# Patient Record
Sex: Male | Born: 1995 | Race: Black or African American | Hispanic: No | Marital: Single | Smoking: Current some day smoker
Health system: Southern US, Community
[De-identification: ages and names within clinical notes are randomized; demographics above are authoritative.]

---
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
## 2016-12-21 ENCOUNTER — Ambulatory Visit (HOSPITAL_COMMUNITY)
Admission: EM | Admit: 2016-12-21 | Discharge: 2016-12-21 | Disposition: A | Discharge status: Home / Self Care | Payer: Self-pay | Attending: Family | Admitting: Family

## 2016-12-21 ENCOUNTER — Encounter (HOSPITAL_COMMUNITY): Payer: Self-pay | Admitting: Emergency Medicine

## 2016-12-21 DIAGNOSIS — K0889 Other specified disorders of teeth and supporting structures: Secondary | ICD-10-CM

## 2016-12-21 DIAGNOSIS — K047 Periapical abscess without sinus: Secondary | ICD-10-CM

## 2016-12-21 MED ORDER — AMOXICILLIN-POT CLAVULANATE 875-125 MG PO TABS: 1.0000 | ORAL_TABLET | Freq: Two times a day (BID) | ORAL | 0 refills | Status: AC

## 2016-12-21 MED ORDER — NAPROXEN 500 MG PO TABS: 500.0000 mg | ORAL_TABLET | Freq: Two times a day (BID) | ORAL | 0 refills | Status: DC | PRN

## 2016-12-21 NOTE — Discharge Instructions (Signed)
Recommend start Augmentin 875mg  twice a day as directed for dental infection. May take Naproxen 500mg  every 12 hours as needed for pain and inflammation. May gargle with salt water and continue to apply OraGel as needed for comfort. You need to see a dentist for continued care and evaluation.

## 2016-12-21 NOTE — ED Triage Notes (Signed)
Pt has been having left lower dental pain for one week.  Pt does not have a dentist.

## 2016-12-21 NOTE — ED Provider Notes (Cosign Needed)
CSN: [id number]     Arrival date & time 12/21/16  1010 History   First MD Initiated Contact with Patient 12/21/16 1102     Chief Complaint  Patient presents with  . Dental Pain   (Consider location/radiation/quality/duration/timing/severity/associated sxs/prior Treatment) 21 year old male presents with left lower dental pain for the past 2 weeks. Pain has become more severe in the past week. Also noticed some discharge from the gum near his last molar. Denies any fever or GI symptoms. Has used Advil and Oragel with some relief. Has not seen a dentist in many years. Does smoke cigars often. Has no other chronic health issues. Takes no daily medication.    The history is provided by the patient.    History reviewed. No pertinent past medical history. History reviewed. No pertinent surgical history. History reviewed. No pertinent family history. Social History  Substance Use Topics  . Smoking status: Current Some Day Smoker    Types: Cigars  . Smokeless tobacco: Never Used     Comment: black & milds  . Alcohol use Yes     Comment: occasional    Review of Systems  Constitutional: Negative for appetite change, chills, fatigue and fever.  HENT: Positive for dental problem. Negative for congestion, ear pain, facial swelling, mouth sores, postnasal drip, rhinorrhea, sore throat and trouble swallowing.   Respiratory: Negative for cough, chest tightness, shortness of breath and wheezing.   Cardiovascular: Negative for chest pain.  Gastrointestinal: Negative for abdominal pain, diarrhea, nausea and vomiting.  Musculoskeletal: Negative for neck pain and neck stiffness.  Skin: Negative for rash.  Neurological: Positive for headaches. Negative for dizziness, syncope, weakness and light-headedness.  Hematological: Positive for adenopathy.    Allergies  Patient has no known allergies.  Home Medications   Prior to Admission medications   Medication Sig Start Date End Date Taking?  Authorizing Provider  amoxicillin-clavulanate (AUGMENTIN) 875-125 MG tablet Take 1 tablet by mouth every 12 (twelve) hours. 12/21/16 12/31/16  Sudie Grumbling, NP  naproxen (NAPROSYN) 500 MG tablet Take 1 tablet (500 mg total) by mouth 2 (two) times daily as needed for moderate pain. 12/21/16   Sudie Grumbling, NP   Meds Ordered and Administered this Visit  Medications - No data to display  BP 127/79 (BP Location: Right Arm)   Pulse 66   Temp 98.6 F (37 C) (Oral)   SpO2 100%  No data found.   Physical Exam  Constitutional: He is oriented to person, place, and time. Vital signs are normal. He appears well-developed and well-nourished. No distress.  HENT:  Head: Normocephalic and atraumatic.  Right Ear: Hearing and external ear normal.  Left Ear: Hearing and external ear normal.  Nose: Nose normal.  Mouth/Throat: Oropharynx is clear and moist and mucous membranes are normal. Oral lesions present. Dental abscesses present.    Irritation, redness and swelling present posterior of last molar on left lower area. Slight white discharge present. Very tender to palpation. Also flesh-colored, slightly tender tissue present on lateral aspect of 1st lower left molar. No discharge or drainage. Remainder of teeth are in adequate repair. No distinct caries seen.   Neck: Normal range of motion. Neck supple.  Cardiovascular: Normal rate, regular rhythm and normal heart sounds.   Pulmonary/Chest: Effort normal and breath sounds normal. No respiratory distress. He has no wheezes.  Lymphadenopathy:       Head (right side): No tonsillar and no occipital adenopathy present.       Head (left  side): Tonsillar and occipital adenopathy present.    He has no cervical adenopathy.       Right cervical: No superficial cervical adenopathy present.      Left cervical: No superficial cervical adenopathy present.  Neurological: He is alert and oriented to person, place, and time.  Skin: Skin is warm and dry.  Capillary refill takes less than 2 seconds.  Psychiatric: He has a normal mood and affect. His behavior is normal. Judgment and thought content normal.    Urgent Care Course     Procedures (including critical care time)  Labs Review Labs Reviewed - No data to display  Imaging Review No results found.   Visual Acuity Review  Right Eye Distance:   Left Eye Distance:   Bilateral Distance:    Right Eye Near:   Left Eye Near:    Bilateral Near:         MDM   1. Dental infection   2. Pain, dental    Discussed that he probably has an infection behind his last molar. Recommend start Augmentin 875mg  twice a day as directed. May take Naproxen 500mg  every 12 hours as needed for pain and inflammation. May gargle with salt water and continue to apply OraGel as needed for comfort. Uncertain about etiology of other tissue next to 1st molar- discussed that he needs further evaluation by a dentist. Patient to call and schedule follow-up appointment with a dentist for next week.       Sudie GrumblingAnn Berry Yashvi Jasinski, NP 12/21/16 [phone]

## 2017-04-06 ENCOUNTER — Encounter (HOSPITAL_COMMUNITY): Payer: Self-pay

## 2017-04-06 DIAGNOSIS — Z79899 Other long term (current) drug therapy: Secondary | ICD-10-CM | POA: Insufficient documentation

## 2017-04-06 DIAGNOSIS — K047 Periapical abscess without sinus: Secondary | ICD-10-CM | POA: Insufficient documentation

## 2017-04-06 DIAGNOSIS — K029 Dental caries, unspecified: Secondary | ICD-10-CM | POA: Insufficient documentation

## 2017-04-06 DIAGNOSIS — E041 Nontoxic single thyroid nodule: Secondary | ICD-10-CM | POA: Insufficient documentation

## 2017-04-06 DIAGNOSIS — F17290 Nicotine dependence, other tobacco product, uncomplicated: Secondary | ICD-10-CM | POA: Insufficient documentation

## 2017-04-06 NOTE — ED Triage Notes (Signed)
Pt presents with L sided lower jaw swelling. He is reporting pain in his L lower molars x5 days. Denies SOB or difficulty swallowing. A&Ox4.

## 2017-04-07 ENCOUNTER — Encounter (HOSPITAL_COMMUNITY): Payer: Self-pay | Admitting: Radiology

## 2017-04-07 ENCOUNTER — Emergency Department (HOSPITAL_COMMUNITY): Payer: Self-pay

## 2017-04-07 ENCOUNTER — Emergency Department (HOSPITAL_COMMUNITY)
Admission: EM | Admit: 2017-04-07 | Discharge: 2017-04-07 | Disposition: A | Discharge status: Home / Self Care | Payer: Self-pay | Attending: Emergency Medicine | Admitting: Emergency Medicine

## 2017-04-07 DIAGNOSIS — K029 Dental caries, unspecified: Secondary | ICD-10-CM

## 2017-04-07 DIAGNOSIS — E041 Nontoxic single thyroid nodule: Secondary | ICD-10-CM

## 2017-04-07 DIAGNOSIS — K047 Periapical abscess without sinus: Secondary | ICD-10-CM

## 2017-04-07 LAB — I-STAT CHEM 8, ED
BUN: 15 mg/dL (ref 6–20)
Calcium, Ion: 1.24 mmol/L (ref 1.15–1.40)
Chloride: 102 mmol/L (ref 101–111)
Creatinine, Ser: 0.7 mg/dL (ref 0.61–1.24)
Glucose, Bld: 103 mg/dL — ABNORMAL HIGH (ref 65–99)
HCT: 45 % (ref 39.0–52.0)
Hemoglobin: 15.3 g/dL (ref 13.0–17.0)
Potassium: 3.5 mmol/L (ref 3.5–5.1)
Sodium: 141 mmol/L (ref 135–145)
TCO2: 28 mmol/L (ref 0–100)

## 2017-04-07 MED ORDER — HYDROCODONE-ACETAMINOPHEN 5-325 MG PO TABS: 1.0000 | ORAL_TABLET | Freq: Four times a day (QID) | ORAL | 0 refills | Status: DC | PRN

## 2017-04-07 MED ORDER — BENZOCAINE 20 % MT AERO
INHALATION_SPRAY | Freq: Once | OROMUCOSAL | Status: AC
  Administered 2017-04-07: 06:00:00 via OROMUCOSAL
  Filled 2017-04-07: qty 57

## 2017-04-07 MED ORDER — IOPAMIDOL (ISOVUE-300) INJECTION 61%
INTRAVENOUS | Status: AC
  Filled 2017-04-07: qty 75

## 2017-04-07 MED ORDER — HYDROCODONE-ACETAMINOPHEN 5-325 MG PO TABS
2.0000 | ORAL_TABLET | Freq: Once | ORAL | Status: AC
  Administered 2017-04-07: 2 via ORAL
  Filled 2017-04-07: qty 2

## 2017-04-07 MED ORDER — CLINDAMYCIN HCL 300 MG PO CAPS
600.0000 mg | ORAL_CAPSULE | Freq: Once | ORAL | Status: AC
  Administered 2017-04-07: 600 mg via ORAL
  Filled 2017-04-07: qty 2

## 2017-04-07 MED ORDER — NAPROXEN 500 MG PO TABS: 500.0000 mg | ORAL_TABLET | Freq: Two times a day (BID) | ORAL | 0 refills | Status: DC

## 2017-04-07 MED ORDER — IOPAMIDOL (ISOVUE-300) INJECTION 61%
75.0000 mL | Freq: Once | INTRAVENOUS | Status: AC | PRN
  Administered 2017-04-07: 75 mL via INTRAVENOUS

## 2017-04-07 MED ORDER — CLINDAMYCIN HCL 150 MG PO CAPS: 450.0000 mg | ORAL_CAPSULE | Freq: Three times a day (TID) | ORAL | 0 refills | Status: DC

## 2017-04-07 NOTE — ED Provider Notes (Signed)
[facility]-EMERGENCY DEPT Provider Note   CSN: [id number] Arrival date & time: 04/06/17  2220     History   Chief Complaint Chief Complaint  Patient presents with  . Facial Swelling  . Dental Pain    HPI Anthony Fletcher is a 21 y.o. male with no major medical problems presents to the Emergency Department complaining of gradual, persistent, progressively worsening left lower jaw swelling onset 1 day ago. Associated symptoms include left lower dental pain onset 5 days ago.  Pt has been taking ibuprofen without relief.  Pt denies dysphagia, fever, N/V/D, CP, SOB.  Pt reports that eating makes the symptoms worse and has therefore not eaten much in the last few days.  Pt reports similar symptoms 3 mos ago and treated with Augmentin.  Pt did not follow-up with dentist.     The history is provided by the patient and medical records. No language interpreter was used.    History reviewed. No pertinent past medical history.  There are no active problems to display for this patient.   History reviewed. No pertinent surgical history.     Home Medications    Prior to Admission medications   Medication Sig Start Date End Date Taking? Authorizing Provider  ibuprofen (ADVIL,MOTRIN) 200 MG tablet Take 400 mg by mouth every 6 (six) hours as needed for headache, mild pain or moderate pain.   Yes [provider]  clindamycin (CLEOCIN) 150 MG capsule Take 3 capsules (450 mg total) by mouth 3 (three) times daily. 04/07/17   Shiva Karis, Dahlia Client, PA-C  HYDROcodone-acetaminophen (NORCO/VICODIN) 5-325 MG tablet Take 1 tablet by mouth every 6 (six) hours as needed. 04/07/17   Turrell Severt, Dahlia Client, PA-C  naproxen (NAPROSYN) 500 MG tablet Take 1 tablet (500 mg total) by mouth 2 (two) times daily with a meal. 04/07/17   Keina Mutch, Boyd Kerbs    Family History History reviewed. No pertinent family history.  Social History Social History  Substance Use Topics  . Smoking status: Current Some  Day Smoker    Types: Cigars  . Smokeless tobacco: Never Used     Comment: black & milds  . Alcohol use Yes     Comment: occasional     Allergies   Patient has no known allergies.   Review of Systems Review of Systems  Constitutional: Negative for appetite change, chills and fever.  HENT: Positive for dental problem and facial swelling. Negative for drooling, ear pain, nosebleeds, postnasal drip, rhinorrhea and trouble swallowing.   Eyes: Negative for pain and redness.  Respiratory: Negative for cough and wheezing.   Cardiovascular: Negative for chest pain.  Gastrointestinal: Negative for abdominal pain, nausea and vomiting.  Musculoskeletal: Negative for neck pain and neck stiffness.  Skin: Negative for color change and rash.  Neurological: Negative for weakness, light-headedness and headaches.  All other systems reviewed and are negative.    Physical Exam Updated Vital Signs BP (!) 142/88 (BP Location: Left Arm)   Pulse 82   Temp 98.4 F (36.9 C) (Oral)   Resp 20   SpO2 100%   Physical Exam  Constitutional: He appears well-developed and well-nourished.  HENT:  Head: Normocephalic.  Right Ear: Tympanic membrane, external ear and ear canal normal.  Left Ear: Tympanic membrane, external ear and ear canal normal.  Nose: Nose normal. Right sinus exhibits no maxillary sinus tenderness and no frontal sinus tenderness. Left sinus exhibits no maxillary sinus tenderness and no frontal sinus tenderness.  Mouth/Throat: Uvula is midline, oropharynx is clear and moist  and mucous membranes are normal. No oral lesions. There is trismus in the jaw. Abnormal dentition. Dental caries present. No uvula swelling or lacerations. No oropharyngeal exudate, posterior oropharyngeal edema, posterior oropharyngeal erythema or tonsillar abscesses.    Dental caries tooth 17, 18 and 19 No fluctuance or induration to the buccal mucosa or floor of the mouth  Eyes: Conjunctivae are normal. Pupils are  equal, round, and reactive to light. Right eye exhibits no discharge. Left eye exhibits no discharge.  Neck: Normal range of motion. Neck supple.  No stridor Handling secretions without difficulty No nuchal rigidity  Cardiovascular: Normal rate, regular rhythm and normal heart sounds.   Pulmonary/Chest: Effort normal. No respiratory distress.  Equal chest rise  Abdominal: Soft. Bowel sounds are normal. He exhibits no distension. There is no tenderness.  Lymphadenopathy:       Head (left side): Submental, submandibular and tonsillar adenopathy present.  Neurological: He is alert.  Skin: Skin is warm and dry.  Psychiatric: He has a normal mood and affect.  Nursing note and vitals reviewed.    ED Treatments / Results  Labs (all labs ordered are listed, but only abnormal results are displayed) Labs Reviewed  I-STAT CHEM 8, ED - Abnormal; Notable for the following:       Result Value   Glucose, Bld 103 (*)    All other components within normal limits    EKG  EKG Interpretation None       Radiology Ct Soft Tissue Neck W Contrast  Result Date: 04/07/2017 CLINICAL DATA:  21 y/o  M; left-sided lower jaw swelling. EXAM: CT NECK WITH CONTRAST TECHNIQUE: Multidetector CT imaging of the neck was performed using the standard protocol following the bolus administration of intravenous contrast. CONTRAST:  75mL ISOVUE-300 IOPAMIDOL (ISOVUE-300) INJECTION 61% COMPARISON:  None. FINDINGS: Pharynx and larynx: Normal. No mass or swelling. Salivary glands: No inflammation, mass, or stone. Thyroid: 7 mm nodule within the right lobe of thyroid. Lymph nodes: None enlarged or abnormal density. Vascular: Negative. Limited intracranial: Negative. Visualized orbits: Negative. Mastoids and visualized paranasal sinuses: Clear. Skeleton: Periapical lucency and dental carie associated with the left mandibular first molar smaller compatible with odontogenic disease. Abscess overlying the left mandibular body  measuring 21 x 7 x 18 mm (AP x ML x CC series 3, image 48 and series 7, image 37) that is likely odontogenic. Extensive surrounding soft tissue throughout the subcutaneous fat and left submandibular space of the face. No extension of inflammatory changes in the deep cervical spaces of the neck. Upper chest: Negative. Other: None. IMPRESSION: 1. Odontogenic abscess along the outer margin of the left mandibular body measuring up to 21 mm. This is probably associated with the left first mandibular molar which demonstrates a dental carie and periapical lucency. 2. Inflammation throughout the subcutaneous fat of the left lower face and left submandibular space. No extension of inflammatory changes into the deep cervical compartments. Electronically Signed   By: Mitzi Hansen M.D.   On: 04/07/2017 05:26    Procedures .Marland KitchenIncision and Drainage Date/Time: 04/07/2017 6:06 AM Performed by: Dierdre Forth Authorized by: Dierdre Forth   Consent:    Consent obtained:  Verbal   Consent given by:  Patient   Risks discussed:  Bleeding, incomplete drainage and infection   Alternatives discussed:  No treatment Location:    Type:  Abscess   Location:  Mouth   Mouth location:  Alveolar process Anesthesia (see MAR for exact dosages):    Anesthesia method:  Topical  application (lolicaine, hurricaine spray) Procedure type:    Complexity:  Simple Procedure details:    Incision types:  Single straight   Incision depth:  Submucosal   Scalpel blade:  11   Wound management:  Probed and deloculated and irrigated with saline   Drainage:  Bloody and purulent   Drainage amount:  Copious   Wound treatment:  Wound left open   Packing materials:  None Post-procedure details:    Patient tolerance of procedure:  Tolerated well, no immediate complications   (including critical care time)  Medications Ordered in ED Medications  iopamidol (ISOVUE-300) 61 % injection (not administered)    HYDROcodone-acetaminophen (NORCO/VICODIN) 5-325 MG per tablet 2 tablet (2 tablets Oral Given 04/07/17 0310)  iopamidol (ISOVUE-300) 61 % injection 75 mL (75 mLs Intravenous Contrast Given 04/07/17 0452)  clindamycin (CLEOCIN) capsule 600 mg (600 mg Oral Given 04/07/17 0546)  Benzocaine (HURRCAINE) 20 % mouth spray ( Mouth/Throat Given 04/07/17 0546)     Initial Impression / Assessment and Plan / ED Course  I have reviewed the triage vital signs and the nursing notes.  Pertinent labs & imaging results that were available during my care of the patient were reviewed by me and considered in my medical decision making (see chart for details).     Patient with toothache.  Gross abscess and trismus. Patient given pain control with improvement in jaw mobility. CT scan shows an odontogenic abscess along the outer margin of the left mandibular body and inflammation throughout the subcutaneous fat but no evidence of deep space infection. Patient is afebrile. Incision and drainage with copious amounts of purulent drainage. Exam unconcerning for Ludwig's angina or spread of infection.  Will treat with clindamycin and anti-inflammatories medicine.  Urged patient to follow-up with dentist.    Of note, patient with thyroid nodule. Patient given copy of CT scan and will follow with PCP for this.   Final Clinical Impressions(s) / ED Diagnoses   Final diagnoses:  Dental abscess  Dental caries  Thyroid nodule    New Prescriptions New Prescriptions   CLINDAMYCIN (CLEOCIN) 150 MG CAPSULE    Take 3 capsules (450 mg total) by mouth 3 (three) times daily.   HYDROCODONE-ACETAMINOPHEN (NORCO/VICODIN) 5-325 MG TABLET    Take 1 tablet by mouth every 6 (six) hours as needed.   NAPROXEN (NAPROSYN) 500 MG TABLET    Take 1 tablet (500 mg total) by mouth 2 (two) times daily with a meal.     Brazen Domangue, Boyd KerbsHannah, PA-C 04/07/17 [id number]    Dione BoozeGlick, David, MD 04/07/17 [phone]
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
## 2017-04-07 NOTE — Discharge Instructions (Signed)
1. Medications: vicodin, clindamycin, naprosyn, usual home medications 2. Treatment: rest, drink plenty of fluids, take medications as prescribed 3. Follow Up: Please followup with dentistry within 1 week for discussion of your diagnoses and further evaluation after today's visit; if you do not have a primary care doctor use the resource guide provided to find one; Return to the ER for high fevers, difficulty breathing, difficulty swallowing or other concerning symptoms

## 2017-08-07 ENCOUNTER — Emergency Department (HOSPITAL_COMMUNITY)
Admission: EM | Admit: 2017-08-07 | Discharge: 2017-08-07 | Disposition: A | Discharge status: Home / Self Care | Payer: Self-pay | Attending: Emergency Medicine | Admitting: Emergency Medicine

## 2017-08-07 ENCOUNTER — Encounter (HOSPITAL_COMMUNITY): Payer: Self-pay | Admitting: Emergency Medicine

## 2017-08-07 DIAGNOSIS — F17210 Nicotine dependence, cigarettes, uncomplicated: Secondary | ICD-10-CM | POA: Insufficient documentation

## 2017-08-07 DIAGNOSIS — Z79899 Other long term (current) drug therapy: Secondary | ICD-10-CM | POA: Insufficient documentation

## 2017-08-07 DIAGNOSIS — K047 Periapical abscess without sinus: Secondary | ICD-10-CM | POA: Insufficient documentation

## 2017-08-07 MED ORDER — CHLORHEXIDINE GLUCONATE 0.12 % MT SOLN: 15.0000 mL | Freq: Two times a day (BID) | OROMUCOSAL | 0 refills | Status: AC

## 2017-08-07 MED ORDER — CLINDAMYCIN HCL 150 MG PO CAPS: 450.0000 mg | ORAL_CAPSULE | Freq: Three times a day (TID) | ORAL | 0 refills | Status: DC

## 2017-08-07 NOTE — Discharge Instructions (Signed)
You have a dental infection. It is very important that you get evaluated by a dentist as soon as possible. Call the dentist listed first thing tomorrow to schedule an appointment. Ibuprofen as needed for pain. Take your full course of antibiotics. Read the instructions below.  Eat a soft or liquid diet and rinse your mouth out after meals with warm water. You should see a dentist or return here at once if you have increased swelling, increased pain or uncontrolled bleeding from the site of your injury.  SEEK MEDICAL CARE IF:  You have increased pain not controlled with medicines.  You have swelling around your tooth, in your face or neck.  You have bleeding which starts, continues, or gets worse.  You have a fever >101 If you are unable to open your mouth

## 2017-08-07 NOTE — ED Provider Notes (Signed)
[facility]-EMERGENCY DEPT Provider Note   CSN: [id number] Arrival date & time: 08/07/17  1554     History   Chief Complaint Chief Complaint  Patient presents with  . Dental Pain    HPI Anthony Fletcher is a 21 y.o. male.  The history is provided by the patient and medical records. No language interpreter was used.  Dental Pain        Anthony Fletcher is a 21 y.o. male who presents to the Emergency Department complaining of persistent, gradually worsening, bilateral, lower dental pain beginning 3-4 months ago, but acutely worsened the last week. Pt describes their pain as constant, throbbing. Pt has tried salt water rinses at home with minimal relief of pain. No medications taken. Pain is exacerbated by eating. They are not currentlyfollowed by dentistry.  Pt denies facial swelling, fever, chills, difficulty breathing, difficulty swallowing.   History reviewed. No pertinent past medical history.  There are no active problems to display for this patient.   History reviewed. No pertinent surgical history.     Home Medications    Prior to Admission medications   Medication Sig Start Date End Date Taking? Authorizing Provider  chlorhexidine (PERIDEX) 0.12 % solution Use as directed 15 mLs in the mouth or throat 2 (two) times daily. 08/07/17   Erinne Gillentine, Chase PicketJaime Pilcher, PA-C  clindamycin (CLEOCIN) 150 MG capsule Take 3 capsules (450 mg total) by mouth 3 (three) times daily. 08/07/17   Judiann Celia, Chase PicketJaime Pilcher, PA-C  HYDROcodone-acetaminophen (NORCO/VICODIN) 5-325 MG tablet Take 1 tablet by mouth every 6 (six) hours as needed. 04/07/17   Muthersbaugh, Dahlia ClientHannah, PA-C  ibuprofen (ADVIL,MOTRIN) 200 MG tablet Take 400 mg by mouth every 6 (six) hours as needed for headache, mild pain or moderate pain.    [provider]  naproxen (NAPROSYN) 500 MG tablet Take 1 tablet (500 mg total) by mouth 2 (two) times daily with a meal. 04/07/17   Muthersbaugh, Dahlia ClientHannah, PA-C     Family History No family history on file.  Social History Social History  Substance Use Topics  . Smoking status: Current Some Day Smoker    Types: Cigars  . Smokeless tobacco: Never Used     Comment: black & milds  . Alcohol use Yes     Comment: occasional     Allergies   Patient has no known allergies.   Review of Systems Review of Systems  Constitutional: Negative for chills and fever.  HENT: Positive for dental problem. Negative for sore throat and trouble swallowing.   Respiratory: Negative for shortness of breath.   Skin: Negative for color change.     Physical Exam Updated Vital Signs BP (!) 144/71 (BP Location: Left Arm)   Pulse 66   Temp 98.5 F (36.9 C) (Oral)   Resp 16   Ht 5\' 11"  (1.803 m)   Wt 72.6 kg (160 lb)   SpO2 99%   BMI 22.32 kg/m   Physical Exam  Constitutional: He appears well-developed and well-nourished. No distress.  HENT:  Head: Normocephalic and atraumatic.  Mouth/Throat:    Dental cavities and poor oral dentition noted. Pain along tooth as depicted in image. No abscess noted. Midline uvula. No trismus. OP moist and clear. No oropharyngeal erythema or edema. Neck supple with no tenderness. No facial edema.  Neck: Neck supple.  Cardiovascular: Normal rate, regular rhythm and normal heart sounds.   No murmur heard. Pulmonary/Chest: Effort normal and breath sounds normal. No respiratory distress. He has no  wheezes. He has no rales.  Musculoskeletal: Normal range of motion.  Neurological: He is alert.  Skin: Skin is warm and dry.  Nursing note and vitals reviewed.    ED Treatments / Results  Labs (all labs ordered are listed, but only abnormal results are displayed) Labs Reviewed - No data to display  EKG  EKG Interpretation None       Radiology No results found.  Procedures Procedures (including critical care time)  Medications Ordered in ED Medications - No data to display   Initial Impression /  Assessment and Plan / ED Course  I have reviewed the triage vital signs and the nursing notes.  Pertinent labs & imaging results that were available during my care of the patient were reviewed by me and considered in my medical decision making (see chart for details).    Anthony Fletcher is a 21 y.o. male who presents to ED for dental pain. No abscess requiring immediate incision and drainage. Patient is afebrile, non toxic appearing, and swallowing secretions well. Exam not concerning for Ludwig's angina or pharyngeal abscess. Will treat with clinda and peridex. I provided referral to dentist and stressed the importance of dental follow up for ultimate management of dental pain. Patient voices understanding and is agreeable to plan.  Final Clinical Impressions(s) / ED Diagnoses   Final diagnoses:  Dental infection    New Prescriptions New Prescriptions   CHLORHEXIDINE (PERIDEX) 0.12 % SOLUTION    Use as directed 15 mLs in the mouth or throat 2 (two) times daily.   CLINDAMYCIN (CLEOCIN) 150 MG CAPSULE    Take 3 capsules (450 mg total) by mouth 3 (three) times daily.     Dvid Pendry, Chase Picket, PA-C 08/07/17 1818    Shaune Pollack, MD 08/08/17 1140

## 2017-08-07 NOTE — ED Triage Notes (Signed)
Patient c/o right and left lower dental pain x2 weeks. Denies fevers. Denies follow up with dentist.

## 2018-10-11 ENCOUNTER — Ambulatory Visit (HOSPITAL_COMMUNITY)
Admission: EM | Admit: 2018-10-11 | Discharge: 2018-10-11 | Discharge status: Against Medical Advice | Payer: Self-pay | Attending: Family Medicine | Admitting: Family Medicine

## 2018-10-11 NOTE — ED Notes (Signed)
Pt did not answer when called for triage x 2.  Will d/c

## 2018-10-11 NOTE — ED Notes (Signed)
Pt called to triage x1.  No answer. 

## 2018-10-12 ENCOUNTER — Emergency Department (HOSPITAL_COMMUNITY): Payer: Self-pay

## 2018-10-12 ENCOUNTER — Other Ambulatory Visit: Payer: Self-pay

## 2018-10-12 ENCOUNTER — Encounter (HOSPITAL_COMMUNITY): Payer: Self-pay | Admitting: Emergency Medicine

## 2018-10-12 ENCOUNTER — Emergency Department (HOSPITAL_COMMUNITY)
Admission: EM | Admit: 2018-10-12 | Discharge: 2018-10-12 | Disposition: A | Discharge status: Home / Self Care | Payer: Self-pay | Attending: Emergency Medicine | Admitting: Emergency Medicine

## 2018-10-12 DIAGNOSIS — R69 Illness, unspecified: Secondary | ICD-10-CM

## 2018-10-12 DIAGNOSIS — F17210 Nicotine dependence, cigarettes, uncomplicated: Secondary | ICD-10-CM | POA: Insufficient documentation

## 2018-10-12 DIAGNOSIS — J111 Influenza due to unidentified influenza virus with other respiratory manifestations: Secondary | ICD-10-CM

## 2018-10-12 DIAGNOSIS — J101 Influenza due to other identified influenza virus with other respiratory manifestations: Secondary | ICD-10-CM | POA: Insufficient documentation

## 2018-10-12 MED ORDER — ALBUTEROL SULFATE HFA 108 (90 BASE) MCG/ACT IN AERS
2.0000 | INHALATION_SPRAY | RESPIRATORY_TRACT | Status: DC | PRN
  Administered 2018-10-12: 2 via RESPIRATORY_TRACT
  Filled 2018-10-12: qty 6.7

## 2018-10-12 MED ORDER — BENZONATATE 200 MG PO CAPS: 200.0000 mg | ORAL_CAPSULE | Freq: Three times a day (TID) | ORAL | 0 refills | Status: AC | PRN

## 2018-10-12 NOTE — ED Triage Notes (Addendum)
Pt brought in by ems for flu like symptoms, pt reports fever, chills, congestion and weakness x 1 week. Pt received liquid tylenol by ems. Girlfriend tested positive flu, other family had pneumonia. Pt did not have the flu shot.

## 2018-10-12 NOTE — ED Provider Notes (Signed)
[facility]-EMERGENCY DEPT Provider Note   CSN: [id number] Arrival date & time: 10/12/18  1313     History   Chief Complaint Chief Complaint  Patient presents with  . flu like symptom  . Fever  . Nasal Congestion    HPI Anthony Fletcher is a 23 y.o. male who presents to the ED via EMS with flu like symptoms that started a week ago. Patient reports chills, congestion, cough and body aches. Patient states that his girlfriend tested positive for flu.  The history is provided by the patient. No language interpreter was used.  Influenza  Presenting symptoms: cough, fatigue, fever, headache, myalgias and sore throat   Presenting symptoms: no vomiting   Severity:  Moderate Onset quality:  Gradual Duration:  1 week Progression:  Worsening Chronicity:  New Relieved by:  Nothing Worsened by:  Nothing Ineffective treatments:  OTC medications Associated symptoms: chills and nasal congestion   Associated symptoms: no ear pain and no neck stiffness   Risk factors: sick contacts     History reviewed. No pertinent past medical history.  There are no active problems to display for this patient.   History reviewed. No pertinent surgical history.      Home Medications    Prior to Admission medications   Medication Sig Start Date End Date Taking? Authorizing Provider  benzonatate (TESSALON) 200 MG capsule Take 1 capsule (200 mg total) by mouth 3 (three) times daily as needed for cough. 10/12/18   Janne Napoleon, NP  chlorhexidine (PERIDEX) 0.12 % solution Use as directed 15 mLs in the mouth or throat 2 (two) times daily. 08/07/17   Ward, Chase Picket, PA-C    Family History History reviewed. No pertinent family history.  Social History Social History   Tobacco Use  . Smoking status: Current Some Day Smoker    Types: Cigars  . Smokeless tobacco: Never Used  . Tobacco comment: black & milds  Substance Use Topics  . Alcohol use: Yes    Comment: occasional   . Drug use: Yes    Frequency: 3.0 times per week    Types: Marijuana     Allergies   Patient has no known allergies.   Review of Systems Review of Systems  Constitutional: Positive for chills, fatigue and fever.  HENT: Positive for congestion and sore throat. Negative for ear pain.   Eyes: Negative for pain, discharge, itching and visual disturbance.  Respiratory: Positive for cough. Negative for wheezing.   Cardiovascular: Negative for chest pain.  Gastrointestinal: Negative for abdominal pain and vomiting.  Genitourinary: Negative for dysuria, frequency and urgency.  Musculoskeletal: Positive for myalgias. Negative for back pain and neck stiffness.  Skin: Negative for rash.  Neurological: Positive for headaches. Negative for syncope.  Hematological: Negative for adenopathy.  Psychiatric/Behavioral: Negative for confusion.     Physical Exam Updated Vital Signs BP 129/68 (BP Location: Right Arm)   Pulse 89   Temp 98.7 F (37.1 C) (Oral)   Resp 16   SpO2 100%   Physical Exam Vitals signs and nursing note reviewed.  Constitutional:      General: He is not in acute distress.    Appearance: He is well-developed.  HENT:     Head: Normocephalic.     Right Ear: Tympanic membrane normal.     Left Ear: Tympanic membrane normal.     Nose: Congestion present.     Mouth/Throat:     Mouth: Mucous membranes are moist.  Pharynx: No posterior oropharyngeal erythema.  Eyes:     Conjunctiva/sclera: Conjunctivae normal.  Neck:     Musculoskeletal: Normal range of motion and neck supple.  Cardiovascular:     Rate and Rhythm: Normal rate.  Pulmonary:     Effort: Pulmonary effort is normal. No respiratory distress.     Breath sounds: No wheezing or rales.  Abdominal:     Palpations: Abdomen is soft.     Tenderness: There is no abdominal tenderness.  Musculoskeletal: Normal range of motion.  Lymphadenopathy:     Cervical: No cervical adenopathy.  Skin:    General: Skin  is warm and dry.  Neurological:     Mental Status: He is alert and oriented to person, place, and time.  Psychiatric:        Mood and Affect: Mood normal.      ED Treatments / Results  Labs (all labs ordered are listed, but only abnormal results are displayed) Labs Reviewed - No data to display  Radiology Dg Chest 2 View  Result Date: 10/12/2018 CLINICAL DATA:  Cough and fever for 1 week, chills, congestion, and weakness, smoker EXAM: CHEST - 2 VIEW COMPARISON:  None FINDINGS: Normal heart size, mediastinal contours, and pulmonary vascularity. Lungs clear. No pulmonary infiltrate, pleural effusion or pneumothorax. Bones unremarkable. IMPRESSION: No acute abnormalities. Electronically Signed   By: Ulyses Southward M.D.   On: 10/12/2018 17:04    Procedures Procedures (including critical care time)  Medications Ordered in ED Medications - No data to display   Initial Impression / Assessment and Plan / ED Course  I have reviewed the triage vital signs and the nursing notes. SUBJECTIVE:  Anthony Fletcher is a 23 y.o. male who present complaining of flu-like symptoms: fevers, chills, myalgias, congestion, sore throat and cough for 7 days. Denies dyspnea or wheezing.  OBJECTIVE: Appears moderately ill but not toxic; temperature as noted in vitals. Ears normal. Throat and pharynx normal.  Neck supple. No adenopathy in the neck. Sinuses non tender. The chest is clear.  ASSESSMENT: Influenza like symptoms  PLAN: Symptomatic therapy suggested: rest, increase fluids, gargle prn for sore throat, use mist of vaporizer prn and return if symptoms persist or worsen. Final Clinical Impressions(s) / ED Diagnoses   Final diagnoses:  Influenza-like illness    ED Discharge Orders         Ordered    benzonatate (TESSALON) 200 MG capsule  3 times daily PRN     10/12/18 1717           Kerrie Buffalo [place], NP 10/13/18 2154    Lorre Nick, MD 10/13/18 2325

## 2019-02-13 IMAGING — CT CT NECK W/ CM
4 of 5 series · 15 of 33 positions shown, 17 images · IV contrast (iopamidol)
Comparison: None.

CLINICAL DATA: 20 y/o  M; left-sided lower jaw swelling.

EXAM:
CT NECK WITH CONTRAST
TECHNIQUE: Multidetector CT imaging of the neck was performed using the
standard protocol following the bolus administration of intravenous
contrast.
CONTRAST:  75mL 5SDOPP-QII IOPAMIDOL (5SDOPP-QII) INJECTION 61%

[Series 3: neck with st · axial · 0.50mm/px · z∈[-231,-117]mm · 3 of 115 slices shown]
[im 29/115  bone]
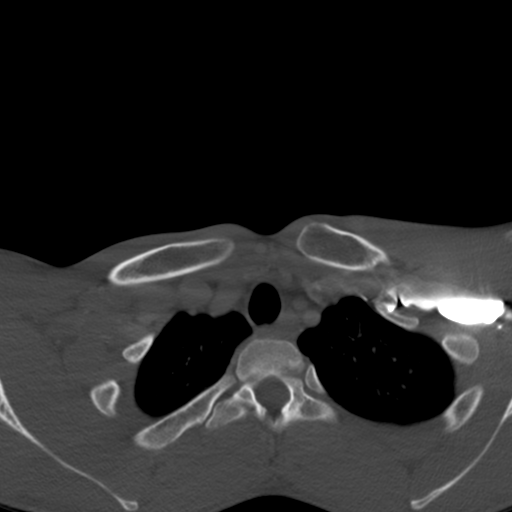
[im 58/115  bone]
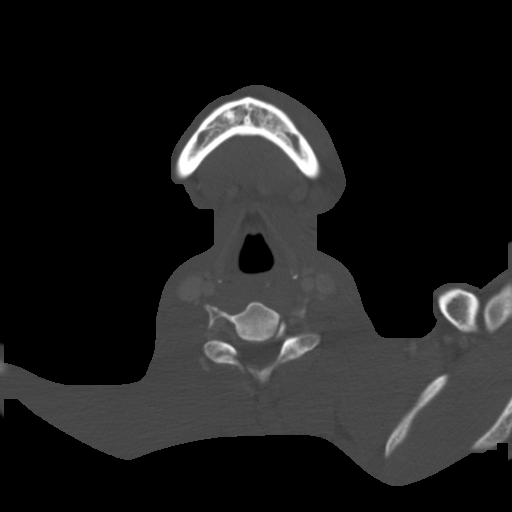
[im 86/115  bone]
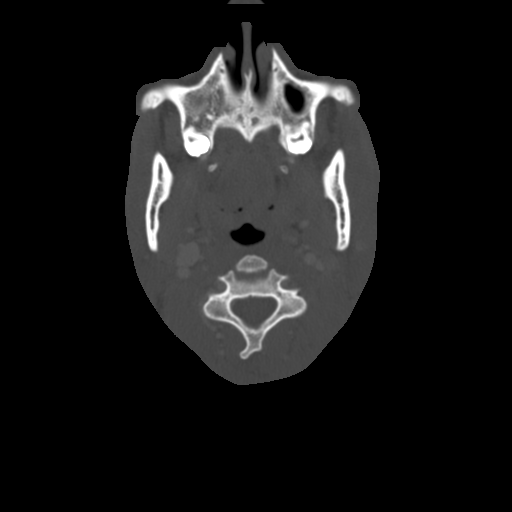

[Series 7: coronal st · coronal · 0.39mm/px · 3 of 101 slices shown]
[im 25/101  bone]
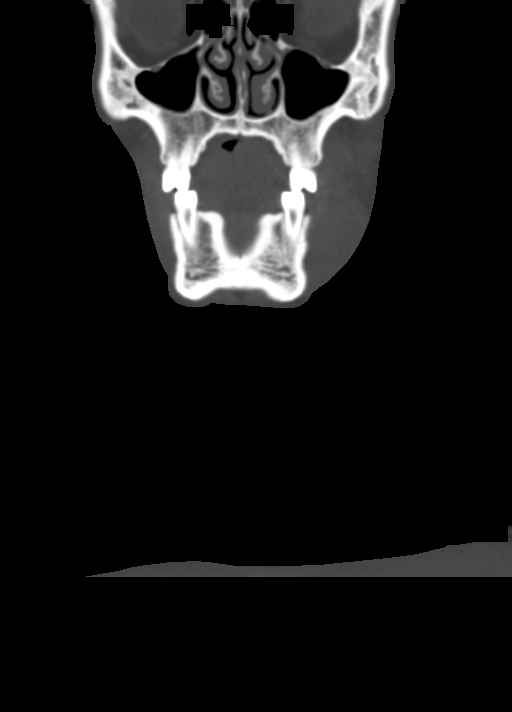
[im 42/101  bone]
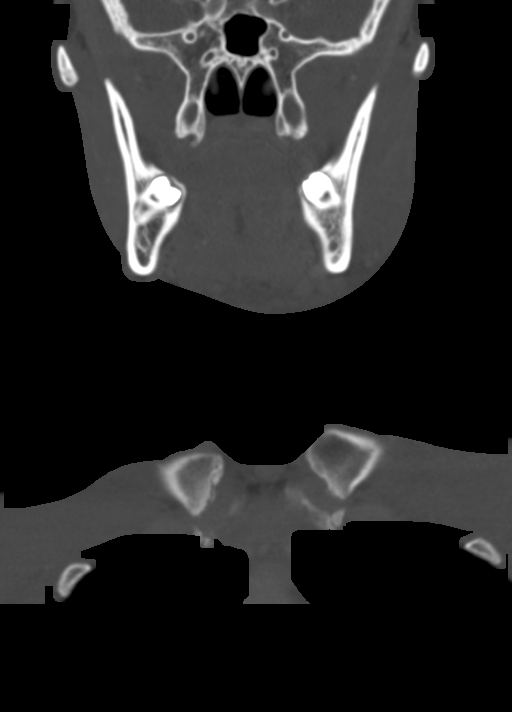
[im 59/101  bone]
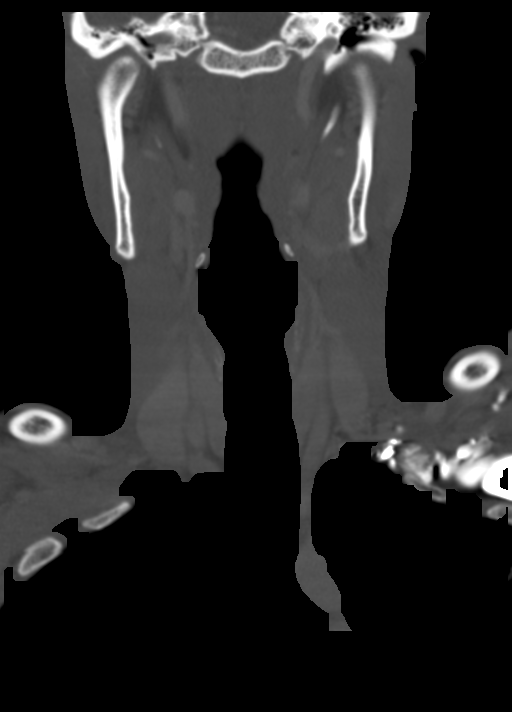

[Series 8: sagittal st · sagittal · 0.46mm/px · 5 of 81 slices shown, 6 images]
[im 27/81  bone]
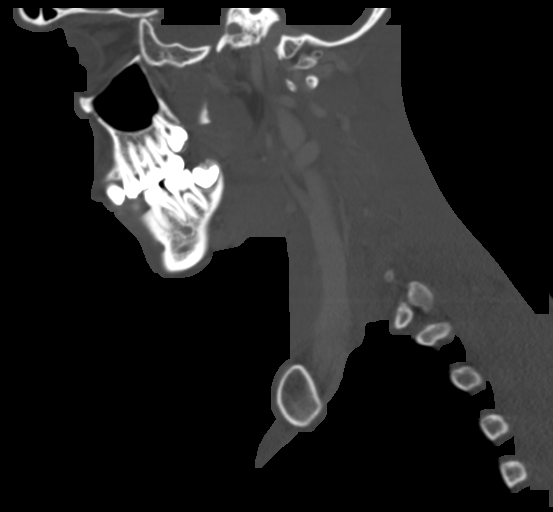
[im 34/81  bone]
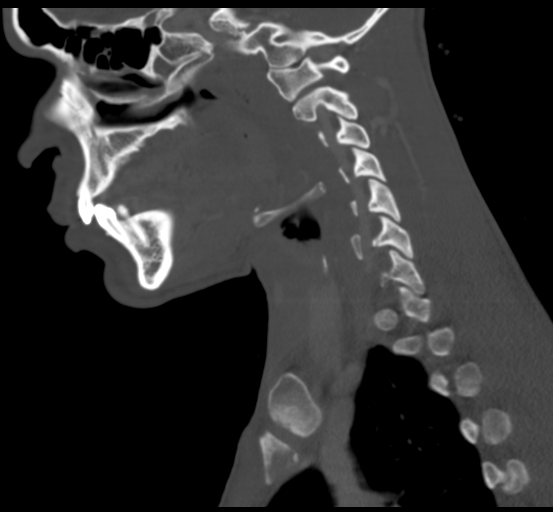
[im 41/81  soft-tissue]
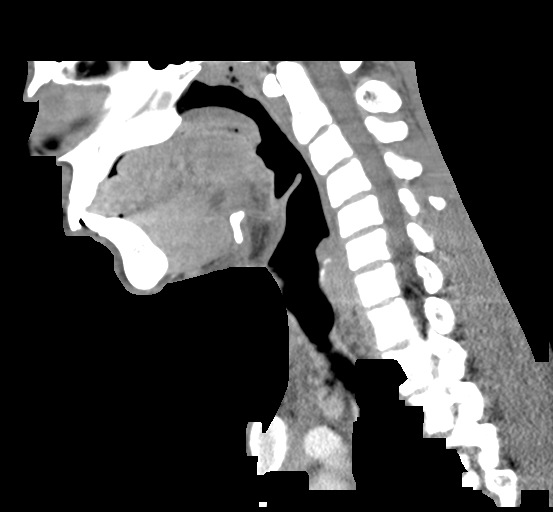
[im 41/81  bone]
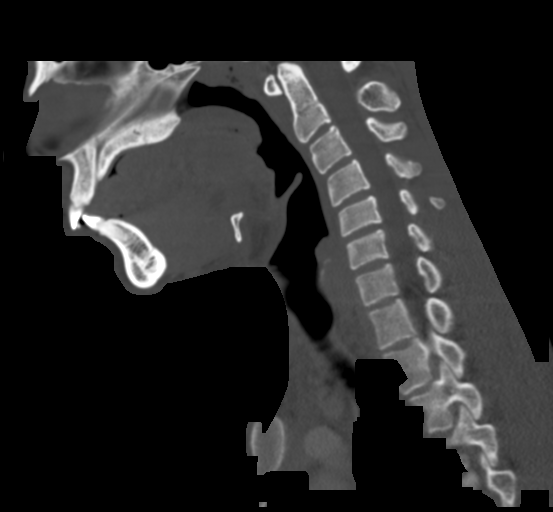
[im 47/81  bone]
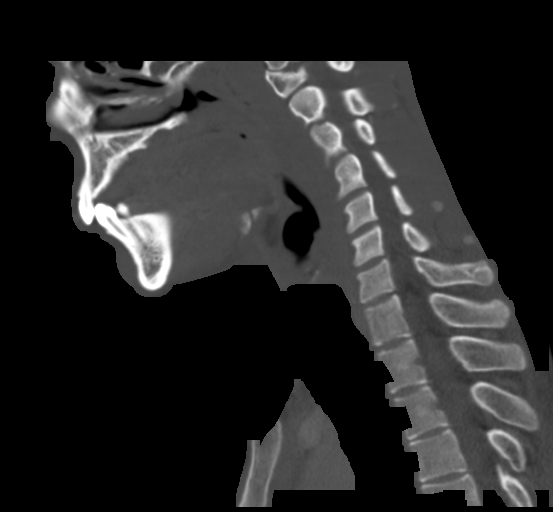
[im 54/81  bone]
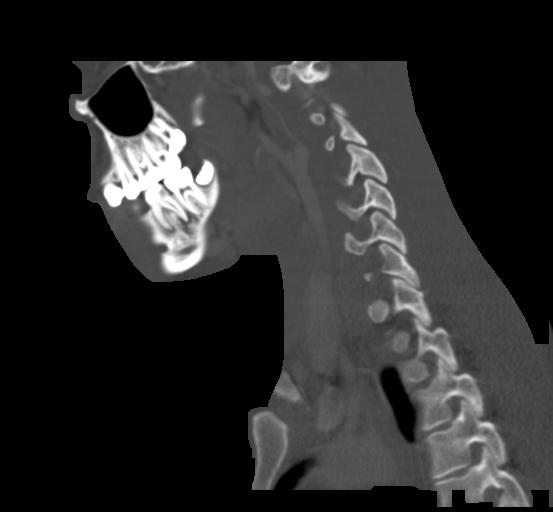

[Series 9: ax axial recons · axial · 0.39mm/px · z∈[-286,-140]mm · 4 of 129 slices shown, 5 images]
[im 26/129  soft-tissue]
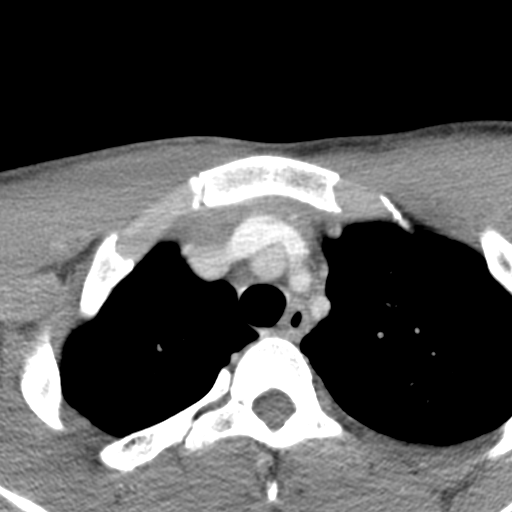
[im 26/129  bone]
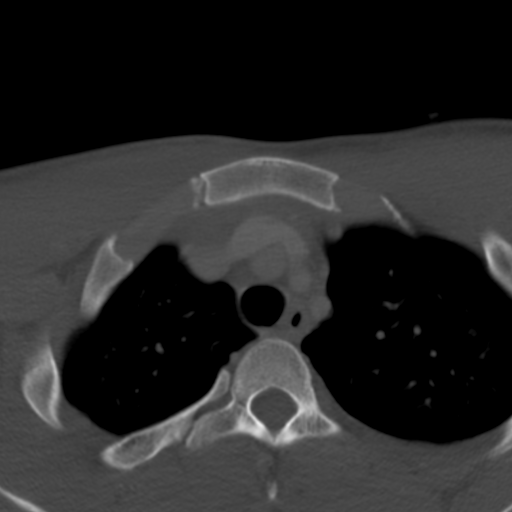
[im 52/129  bone]
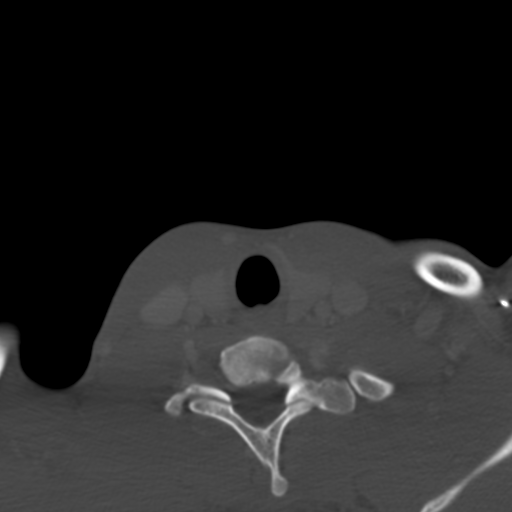
[im 77/129  bone]
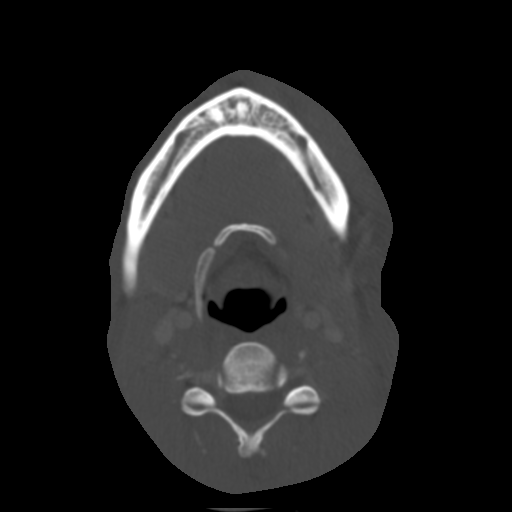
[im 103/129  bone]
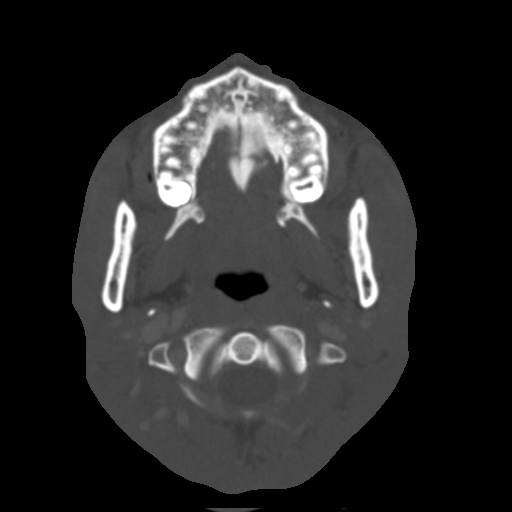

[15 of 33 positions shown; findings below may reference images not displayed]

FINDINGS: Pharynx and larynx: Normal. No mass or swelling.

Salivary glands: No inflammation, mass, or stone.

Thyroid: 7 mm nodule within the right lobe of thyroid.

Lymph nodes: None enlarged or abnormal density.

Vascular: Negative.

Limited intracranial: Negative.

Visualized orbits: Negative.

Mastoids and visualized paranasal sinuses: Clear.

Skeleton: Periapical lucency and dental Adlaho associated with the
left mandibular first molar smaller compatible with odontogenic
disease. Abscess overlying the left mandibular body measuring 21 x 7
x 18 mm (AP x ML x CC series 3, image 48 and series 7, image 37)
that is likely odontogenic. Extensive surrounding soft tissue
throughout the subcutaneous fat and left submandibular space of the
face. No extension of inflammatory changes in the deep cervical
spaces of the neck.

Upper chest: Negative.

Other: None.
IMPRESSION: 1. Odontogenic abscess along the outer margin of the left mandibular
body measuring up to 21 mm. This is probably associated with the
left first mandibular molar which demonstrates a dental Adlaho and
periapical lucency.
2. Inflammation throughout the subcutaneous fat of the left lower
face and left submandibular space. No extension of inflammatory
changes into the deep cervical compartments.

By: Khrisna Garg M.D.

## 2020-08-19 IMAGING — CR DG CHEST 2V
2 series · 2 of 2 positions shown · non-contrast
Comparison: None

CLINICAL DATA: Cough and fever for 1 week, chills, congestion, and
weakness, smoker

EXAM:
CHEST - 2 VIEW

[w chest pa]
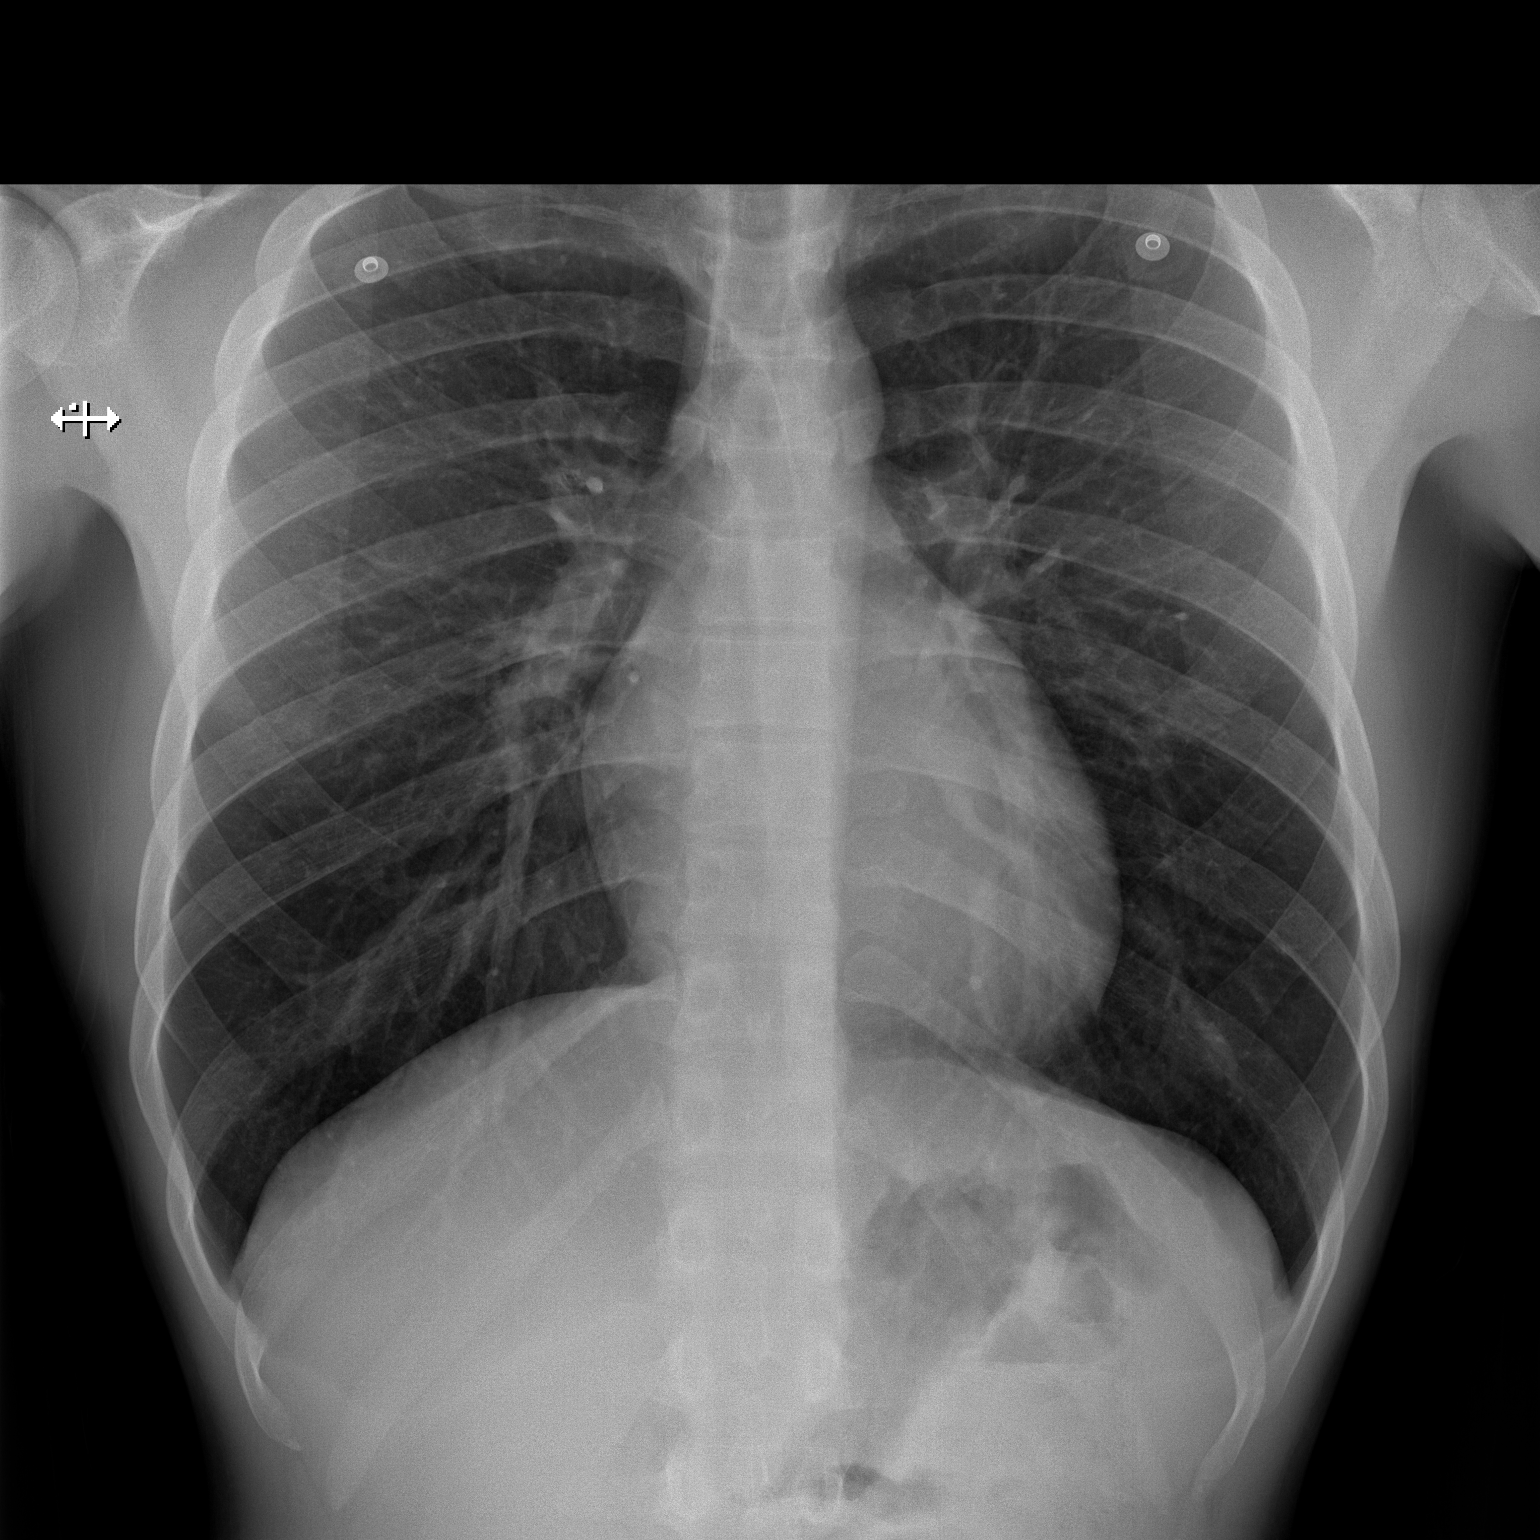

[w chest lat]
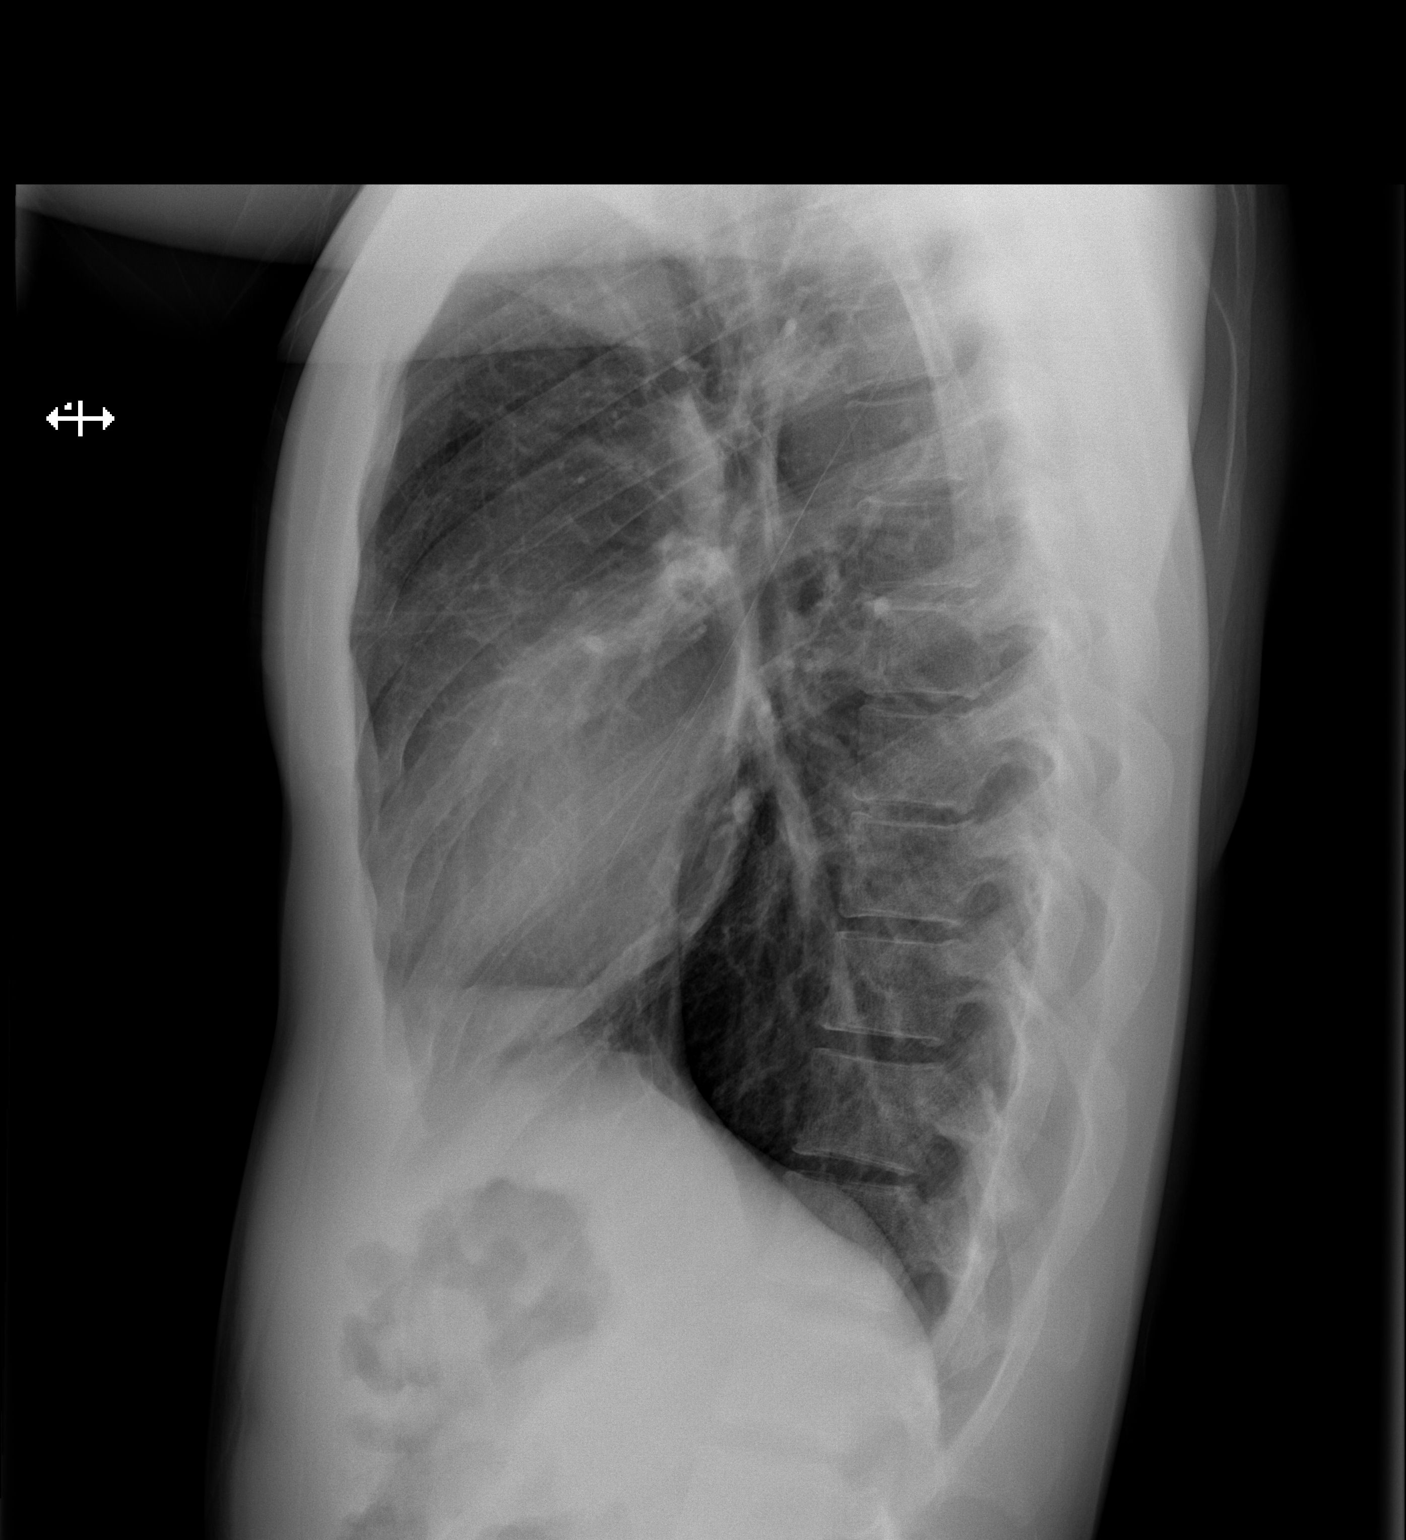

[2 of 2 positions shown; findings below may reference images not displayed]

FINDINGS: Normal heart size, mediastinal contours, and pulmonary vascularity.

Lungs clear.

No pulmonary infiltrate, pleural effusion or pneumothorax.

Bones unremarkable.
IMPRESSION: No acute abnormalities.
